# Patient Record
Sex: Male | Born: 1980 | Race: Black or African American | Hispanic: No | Marital: Married | Smoking: Current every day smoker
Health system: Southern US, Community
[De-identification: ages and names within clinical notes are randomized; demographics above are authoritative.]

---

## 2019-07-15 ENCOUNTER — Other Ambulatory Visit: Payer: Self-pay | Admitting: Family Medicine

## 2019-07-15 ENCOUNTER — Other Ambulatory Visit (HOSPITAL_COMMUNITY): Payer: Self-pay | Admitting: Family Medicine

## 2019-07-15 DIAGNOSIS — N50811 Right testicular pain: Secondary | ICD-10-CM

## 2019-07-17 ENCOUNTER — Ambulatory Visit
Admission: RE | Admit: 2019-07-17 | Discharge: 2019-07-17 | Disposition: A | Discharge status: Home / Self Care | Payer: BC Managed Care – PPO | Source: Ambulatory Visit | Attending: Family Medicine | Admitting: Family Medicine

## 2019-07-17 ENCOUNTER — Other Ambulatory Visit: Payer: Self-pay

## 2019-07-17 ENCOUNTER — Encounter: Payer: Self-pay | Admitting: *Deleted

## 2019-07-17 DIAGNOSIS — N50811 Right testicular pain: Secondary | ICD-10-CM | POA: Insufficient documentation

## 2019-07-17 DIAGNOSIS — N50812 Left testicular pain: Secondary | ICD-10-CM | POA: Insufficient documentation

## 2019-08-06 ENCOUNTER — Other Ambulatory Visit: Payer: Self-pay

## 2019-08-06 ENCOUNTER — Ambulatory Visit (INDEPENDENT_AMBULATORY_CARE_PROVIDER_SITE_OTHER): Payer: BC Managed Care – PPO | Admitting: Urology

## 2019-08-06 ENCOUNTER — Encounter: Payer: Self-pay | Admitting: Urology

## 2019-08-06 VITALS — BP 127/78 | HR 74 | Ht 70.0 in | Wt 215.0 lb

## 2019-08-06 DIAGNOSIS — N5082 Scrotal pain: Secondary | ICD-10-CM | POA: Diagnosis not present

## 2019-08-06 DIAGNOSIS — I861 Scrotal varices: Secondary | ICD-10-CM

## 2019-08-09 ENCOUNTER — Encounter: Payer: Self-pay | Admitting: Urology

## 2019-08-09 DIAGNOSIS — I861 Scrotal varices: Secondary | ICD-10-CM | POA: Insufficient documentation

## 2019-08-09 DIAGNOSIS — N5082 Scrotal pain: Secondary | ICD-10-CM | POA: Insufficient documentation

## 2019-08-09 MED ORDER — ETODOLAC 400 MG PO TABS: 400.0000 mg | ORAL_TABLET | Freq: Two times a day (BID) | ORAL | 0 refills | Status: AC | PRN

## 2019-08-09 NOTE — Progress Notes (Signed)
° °  08/06/2019 11:07 AM   Brett Jackson 08-26-1980 [id number]  Referring provider: Shane Crutch, PA [address] [place],  [place] [address]  Chief Complaint  Patient presents with   New Patient (Initial Visit)    HPI: Brett Jackson is a 39 y.o. male seen at the request of Shane Crutch, PA-C for evaluation of right scrotal pain.   3-4-week history right scrotal pain  Described as dull ache, usually occurs daily  Nonradiating  No identifiable precipitating, aggravating or alleviating factors  No bothersome LUTS  Denies dysuria, gross hematuria  Occasional left scrotal pain  Scrotal ultrasound showed no intratesticular abnormalities, incidental bilateral varicoceles noted  Taking acetaminophen and Advil for pain   PMH: History reviewed. No pertinent past medical history.  Surgical History: History reviewed. No pertinent surgical history.  Home Medications:  Allergies as of 08/06/2019   No Known Allergies     Medication List    as of August 06, 2019 11:59 PM   You have not been prescribed any medications.     Allergies: No Known Allergies  Family History: History reviewed. No pertinent family history.  Social History:  reports that he has been smoking. He has never used smokeless tobacco. No history on file for alcohol use and drug use.   Physical Exam: BP 127/78    Pulse 74    Ht 5\' 10"  (1.778 m)    Wt (!) 215 lb (97.5 kg)    BMI 30.85 kg/m   Constitutional:  Alert and oriented, No acute distress. HEENT: [place] AT, moist mucus membranes.  Trachea midline, no masses. Cardiovascular: No clubbing, cyanosis, or edema. Respiratory: Normal respiratory effort, no increased work of breathing. GI: Abdomen is soft, nontender, nondistended, no abdominal masses GU: Phallus without lesions, testes descended bilaterally without masses or tenderness.  Epididymis palpably normal bilaterally.  Small, bilateral varicoceles palpable R >L, nontender Skin: No rashes,  bruises or suspicious lesions. Neurologic: Grossly intact, no focal deficits, moving all 4 extremities. Psychiatric: Normal mood and affect.   Assessment & Plan:    1.  Scrotal pain  Unlikely varicocele is a source of pain  Recommend scrotal support  Trial etodolac, discontinue Advil  2.  Right varicocele  Schedule CT abdomen pelvis to evaluate for retroperitoneal mass causing right varicocele and other etiologies for referred scrotal pain   , MD  [facility] [address] [place], [place] [phone]

## 2019-09-09 ENCOUNTER — Ambulatory Visit
Admission: RE | Admit: 2019-09-09 | Discharge: 2019-09-09 | Disposition: A | Discharge status: Home / Self Care | Payer: BC Managed Care – PPO | Source: Ambulatory Visit | Attending: Urology | Admitting: Urology

## 2019-09-09 ENCOUNTER — Other Ambulatory Visit: Payer: Self-pay

## 2019-09-09 DIAGNOSIS — N5082 Scrotal pain: Secondary | ICD-10-CM | POA: Diagnosis not present

## 2019-09-09 DIAGNOSIS — I861 Scrotal varices: Secondary | ICD-10-CM | POA: Diagnosis present

## 2019-09-09 MED ORDER — IOHEXOL 300 MG/ML  SOLN
100.0000 mL | Freq: Once | INTRAMUSCULAR | Status: AC | PRN
  Administered 2019-09-09: 100 mL via INTRAVENOUS

## 2019-09-11 ENCOUNTER — Telehealth: Payer: Self-pay | Admitting: *Deleted

## 2019-09-11 NOTE — Telephone Encounter (Signed)
-----   Message from Riki Altes, MD sent at 09/11/2019 11:04 AM EDT ----- CT showed no abnormalities

## 2019-09-15 NOTE — Telephone Encounter (Signed)
Notified patient as instructed, patient pleased. Discussed follow-up appointments, patient agrees  

## 2021-11-08 IMAGING — US US SCROTUM W/ DOPPLER COMPLETE
1 series · 14 of 25 positions shown · non-contrast
Comparison: None.

CLINICAL DATA: Pain in both testicles

EXAM:
SCROTAL ULTRASOUND
DOPPLER ULTRASOUND OF THE TESTICLES
TECHNIQUE: Complete ultrasound examination of the testicles, epididymis, and
other scrotal structures was performed. Color and spectral Doppler
ultrasound were also utilized to evaluate blood flow to the
testicles.

[Series 1: us scrotum w/doppler · 14 of 51 slices shown]
[im 1/51]
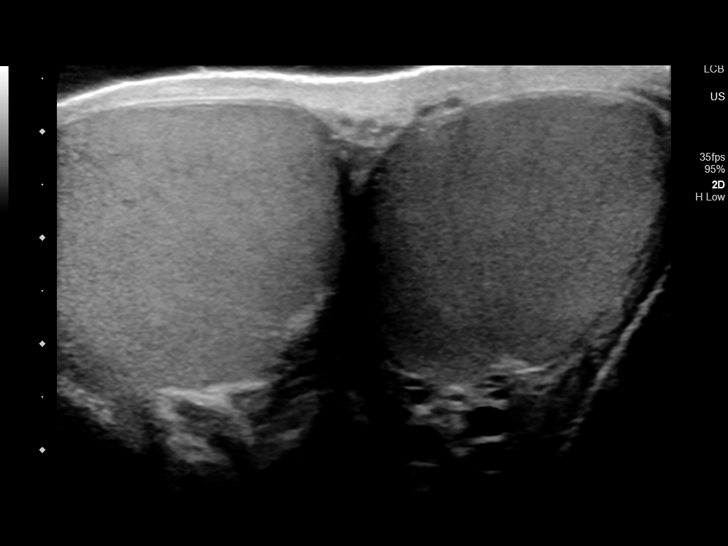
[im 5/51]
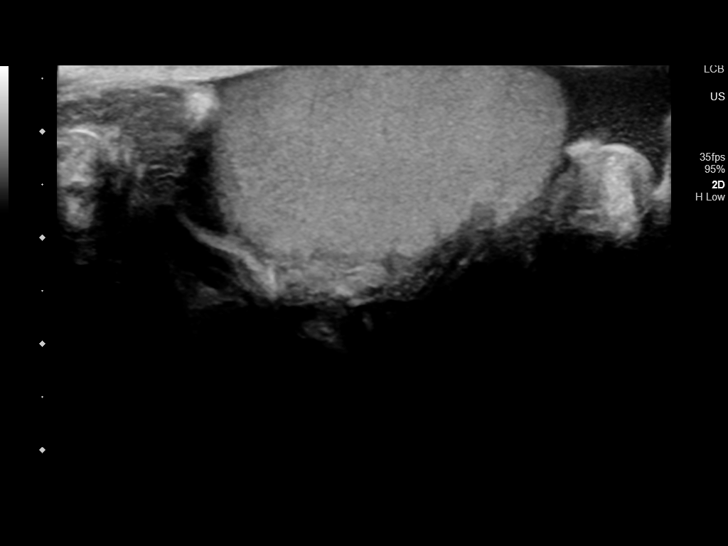
[im 9/51]
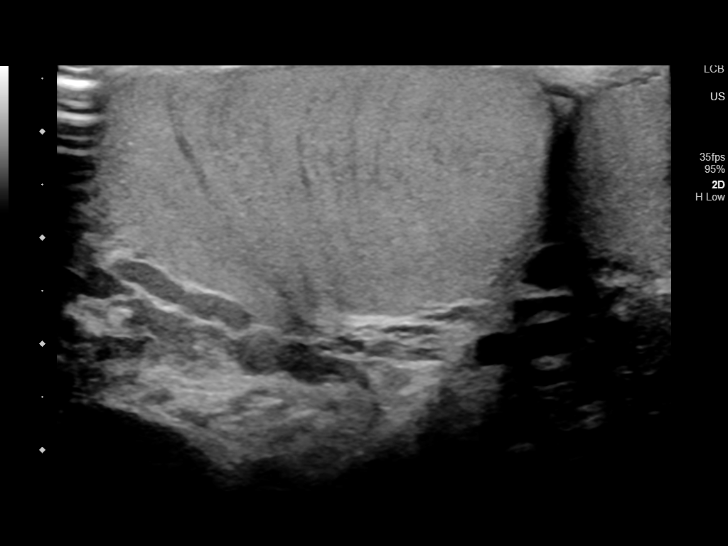
[im 13/51]
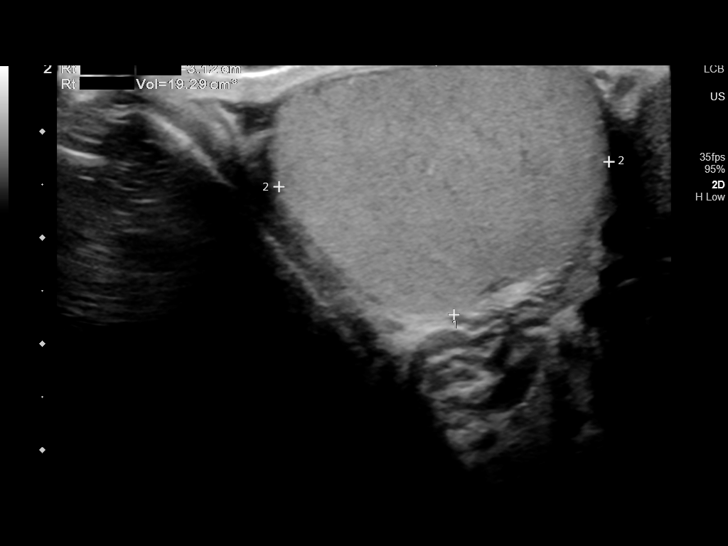
[im 17/51]
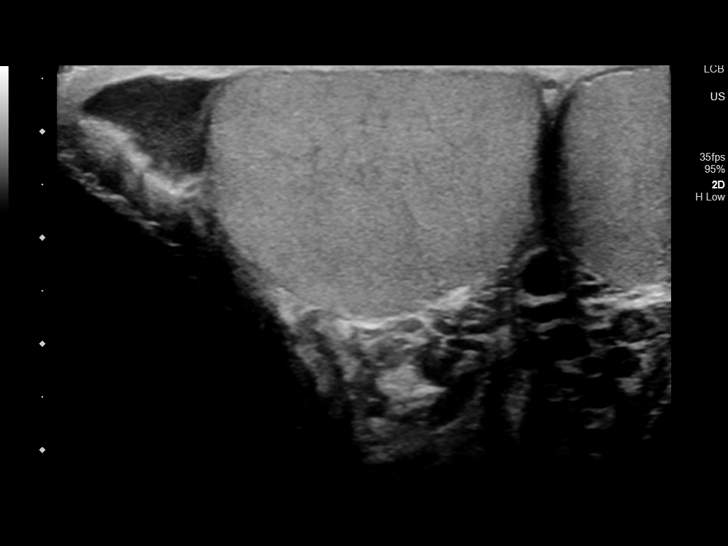
[im 19/51]
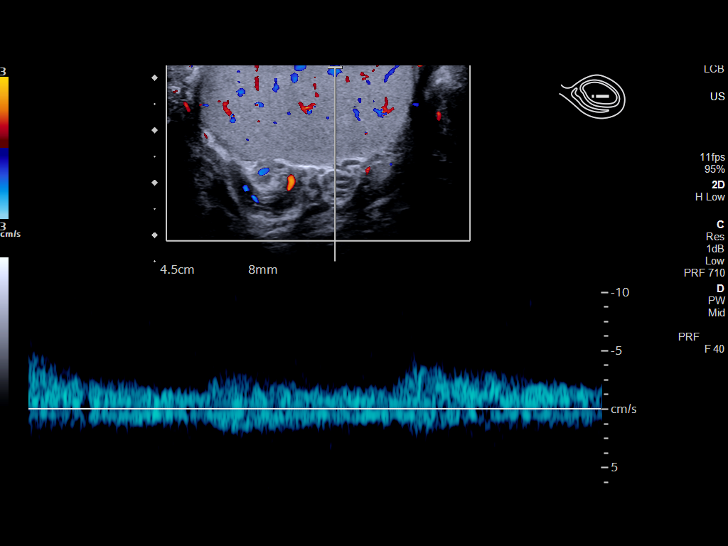
[im 23/51]
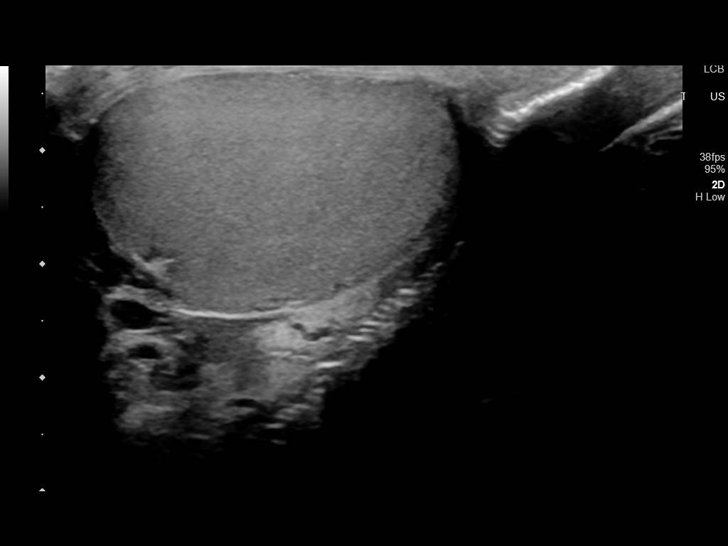
[im 28/51]
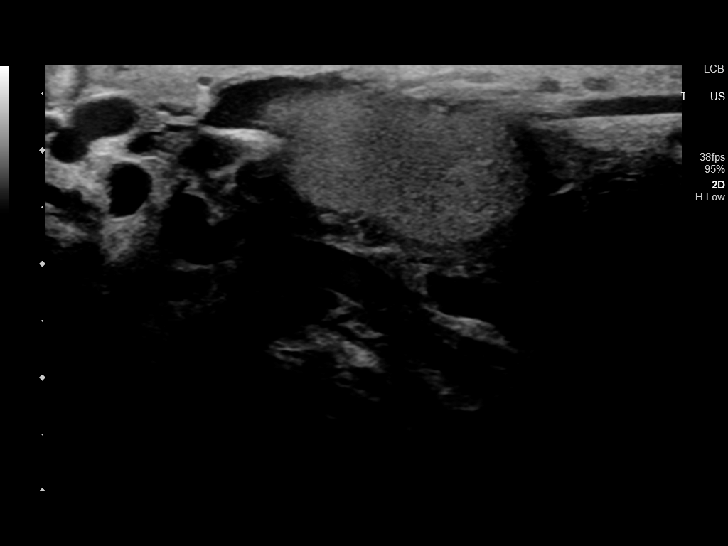
[im 32/51]
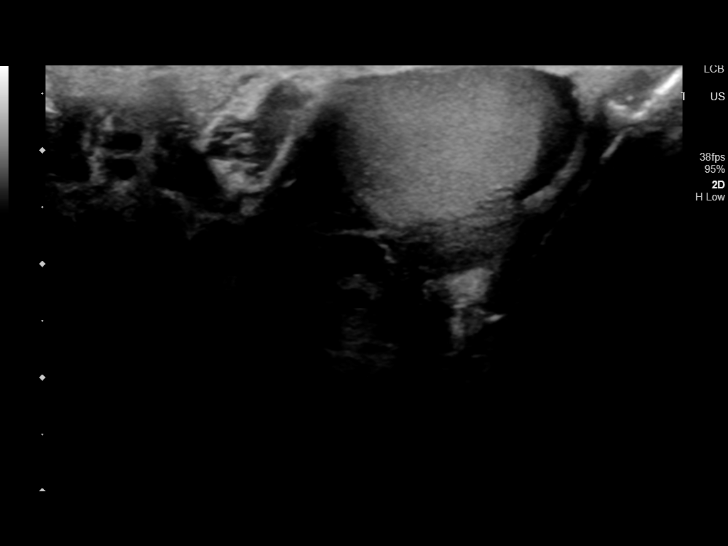
[im 34/51]
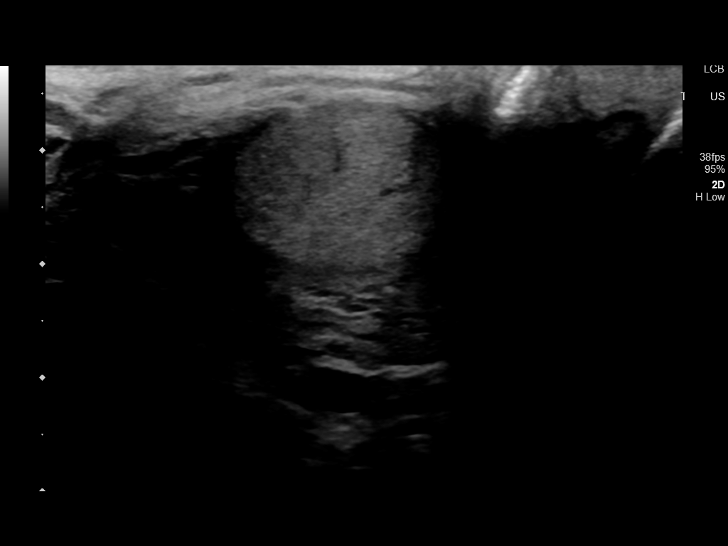
[im 38/51]
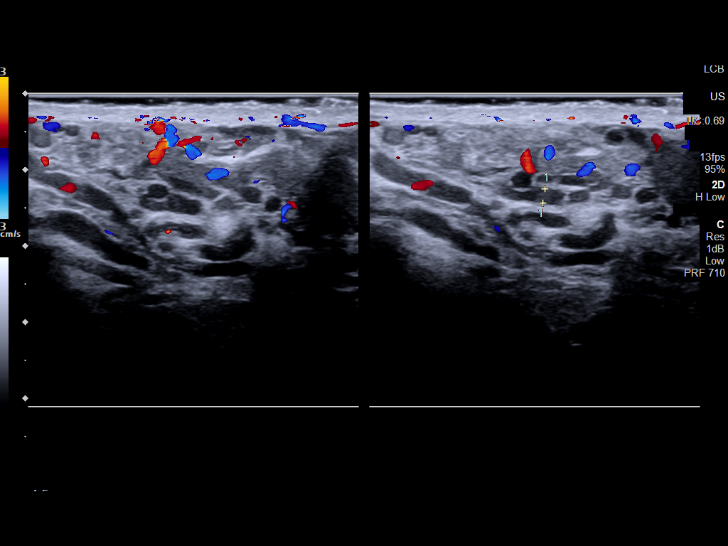
[im 42/51]
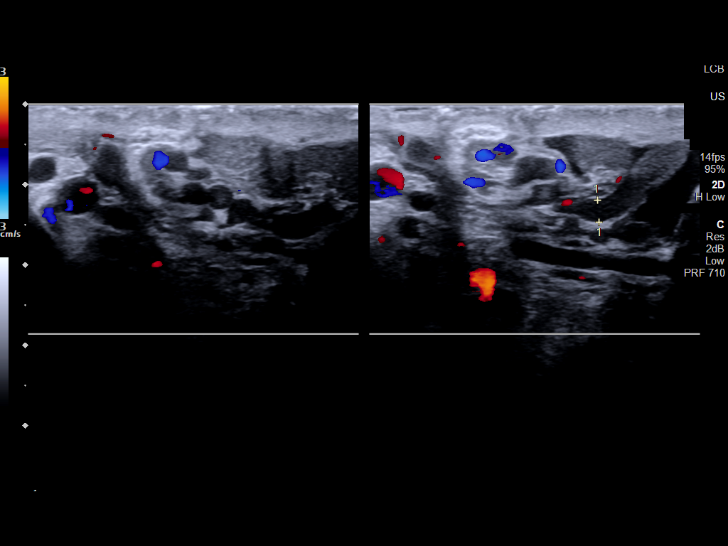
[im 46/51]
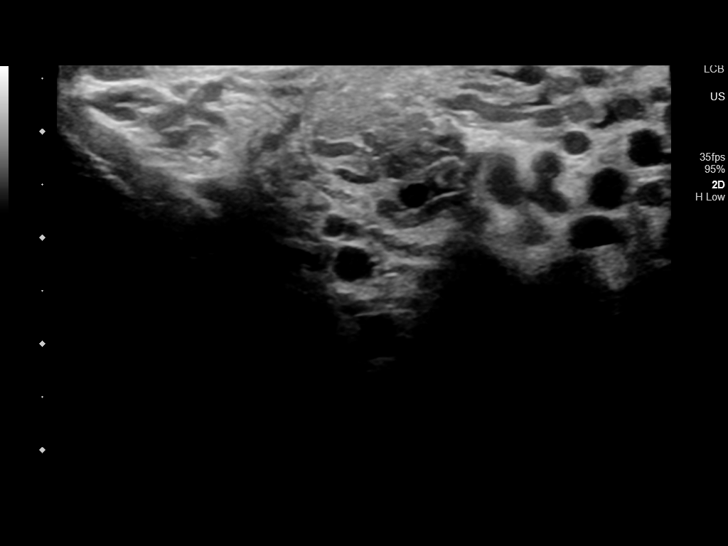
[im 51/51]
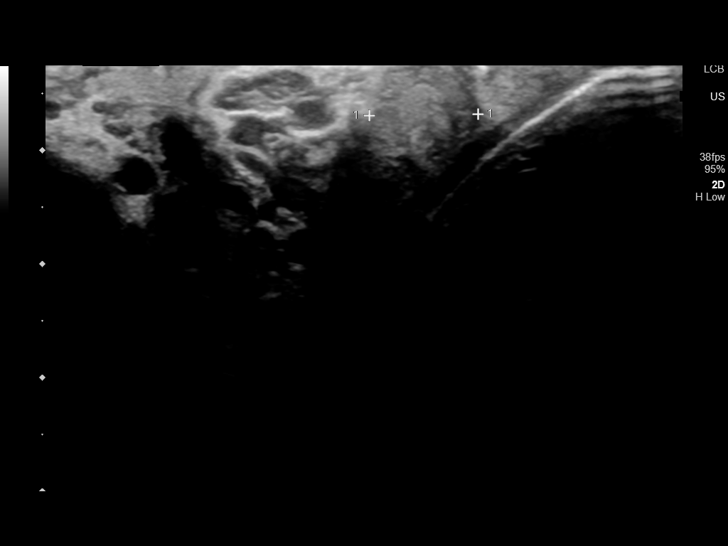

[14 of 25 positions shown; findings below may reference images not displayed]

FINDINGS: Right testicle

Measurements: 4.9 x 2.4 x 3.1 cm. No mass or microlithiasis
visualized.

Left testicle

Measurements: 3.6 x 1.9 x 3.1 cm. No mass or microlithiasis
visualized.

Right epididymis:  Normal in size and appearance.

Left epididymis:  Normal in size and appearance.

Hydrocele:  None visualized.

Varicocele:  There are bilateral varicoceles.

Pulsed Doppler interrogation of both testes demonstrates normal low
resistance arterial and venous waveforms bilaterally.
IMPRESSION: 1. No acute abnormality.
2. Bilateral varicoceles are noted.
